# Patient Record
Sex: Male | Born: 1970 | Race: White | Hispanic: No | Marital: Single | Smoking: Never smoker
Health system: Southern US, Community
[De-identification: ages and names within clinical notes are randomized; demographics above are authoritative.]

---

## 2012-10-28 ENCOUNTER — Ambulatory Visit (HOSPITAL_BASED_OUTPATIENT_CLINIC_OR_DEPARTMENT_OTHER): Payer: BC Managed Care – PPO | Attending: Physician Assistant | Admitting: Radiology

## 2012-10-28 VITALS — Ht 68.0 in | Wt 373.0 lb

## 2012-10-28 DIAGNOSIS — G4733 Obstructive sleep apnea (adult) (pediatric): Secondary | ICD-10-CM | POA: Insufficient documentation

## 2012-10-31 DIAGNOSIS — G4733 Obstructive sleep apnea (adult) (pediatric): Secondary | ICD-10-CM

## 2012-10-31 NOTE — Procedures (Signed)
NAME:  Willie Robbins, Willie Robbins NO.:  [id number]  MEDICAL RECORD NO.:  [id number]          PATIENT TYPE:  OUT  LOCATION:  SLEEP CENTER                 FACILITY:  [facility]  PHYSICIAN:  Clinton D. Maple Hudson, MD, FCCP, FACPDATE OF BIRTH:  08-11-1969  DATE OF STUDY:  10/28/2012                           NOCTURNAL POLYSOMNOGRAM  REFERRING PHYSICIAN:  Guy Sandifer. Henderson Cloud, M.D.  INDICATION FOR STUDY:  Hypersomnia with sleep apnea.  Epworth sleepiness score 22/24, BMI 56.7, weight 373 pounds, height 68 inches, neck 17.5 inches.  MEDICATIONS:  Home medications are charted for review.  SLEEP ARCHITECTURE:  Split study protocol.  During the diagnostic phase, total sleep time 128.5 minutes with sleep efficiency 64.1%.  Stage I was 12.8%, stage II 87.2%, stage III and REM were absent.  Sleep latency 19 minutes.  Awake after sleep onset 48.5 minutes.  Arousal index of 114.4.  BEDTIME MEDICATION:  None.  RESPIRATORY DATA:  Split study protocol.  Apnea/hypopnea index (AHI) 130.3 per hour.  A total of 279 events was scored including 135 obstructive apneas, 1 central apnea, 26 mixed apneas, and 117 hypopneas. Events were not positional.  CPAP was titrated to 16 CWP, AHI 0 per hour.  He wore a medium [employer] Simplus full-face mask with heated humidifier.  OXYGEN DATA:  Loud snoring with oxygen desaturation to a nadir of 55% before CPAP.  With CPAP control, snoring was prevented, and mean oxygen saturation of 91.2% on room air.  CARDIAC DATA:  Sinus rhythm with occasional PAC and PVC.  MOVEMENT/PARASOMNIA:  No significant movement disturbance.  Bathroom x2.  IMPRESSION/RECOMMENDATION: 1. Severe obstructive sleep apnea/hypopnea syndrome, AHI 130.3 per     hour with non-positional events.  Loud snoring with oxygen     desaturation to a nadir of 55% on room air. 2. Successful CPAP titration to 16 CWP, AHI 0 per hour.  He wore a     medium [employer] Simplus full-     face  mask with heated humidifier.  Snoring was prevented and mean     oxygen saturation held 91.2% on room air with CPAP.     Clinton D. Maple Hudson, MD, [facility], FACP Diplomate, American Board of Sleep Medicine    CDY/MEDQ  D:  10/31/2012 09:25:09  T:  10/31/2012 11:27:21  Job:  [id number]

## 2013-03-12 ENCOUNTER — Ambulatory Visit (INDEPENDENT_AMBULATORY_CARE_PROVIDER_SITE_OTHER): Payer: Self-pay | Admitting: Surgery

## 2018-07-17 ENCOUNTER — Emergency Department (HOSPITAL_COMMUNITY)
Admission: EM | Admit: 2018-07-17 | Discharge: 2018-07-17 | Disposition: A | Discharge status: Home / Self Care | Payer: Self-pay | Attending: Emergency Medicine | Admitting: Emergency Medicine

## 2018-07-17 ENCOUNTER — Other Ambulatory Visit: Payer: Self-pay

## 2018-07-17 ENCOUNTER — Emergency Department (HOSPITAL_COMMUNITY): Payer: Self-pay

## 2018-07-17 ENCOUNTER — Encounter (HOSPITAL_COMMUNITY): Payer: Self-pay | Admitting: Emergency Medicine

## 2018-07-17 DIAGNOSIS — M25511 Pain in right shoulder: Secondary | ICD-10-CM | POA: Insufficient documentation

## 2018-07-17 MED ORDER — DICLOFENAC EPOLAMINE 1.3 % TD PTCH
1.0000 | MEDICATED_PATCH | Freq: Two times a day (BID) | TRANSDERMAL | Status: DC
  Administered 2018-07-17: 08:00:00 1 via TRANSDERMAL
  Filled 2018-07-17: qty 1

## 2018-07-17 MED ORDER — DICLOFENAC EPOLAMINE 1.3 % TD PTCH: 1.0000 | MEDICATED_PATCH | Freq: Two times a day (BID) | TRANSDERMAL | 1 refills | Status: AC

## 2018-07-17 MED ORDER — PREDNISONE 20 MG PO TABS: 40.0000 mg | ORAL_TABLET | Freq: Every day | ORAL | 0 refills | Status: AC

## 2018-07-17 NOTE — ED Provider Notes (Signed)
[facility]   CSN: [id number] Arrival date & time: 07/17/18  [id number]    History   Chief Complaint Chief Complaint  Patient presents with  . Shoulder Pain    HPI Willie JunesJames R Spina Jr. is a 48 y.o. male.     HPI Patient presents concern of shoulder pain. Patient has had issues with his shoulder for at least 6 months after an initial fall that occurred, with subsequent minor trauma as well. He notes that since the initial event he has had soreness, pain with range of motion of the shoulder, which has been chronic, without clear attempts at relief. 1 week ago the patient saw a chiropractor for the first time. He notes that after manipulation of the arm, neck, he had transient improvement. He has not yet seen the chiropractor again. Overnight the patient was restless, had unrelenting pain in the shoulder, worse with motion, preventing him from sleeping. No pain in the elbow, wrist, no numbness or tingling distally, no additional medication take this morning for relief. History reviewed. No pertinent past medical history.  There are no active problems to display for this patient.   History reviewed. No pertinent surgical history.      Home Medications    Prior to Admission medications   Medication Sig Start Date End Date Taking? Authorizing Provider  diclofenac (FLECTOR) 1.3 % PT[facility] Place 1 patch onto the skin 2 (two) times daily. 07/17/18   Gerhard MunchLockwood, Roger Kettles, MD  predniSONE (DELTASONE) 20 MG tablet Take 2 tablets (40 mg total) by mouth daily with breakfast. For the next four days 07/17/18   Gerhard MunchLockwood, Severn Goddard, MD    Family History History reviewed. No pertinent family history.  Social History Social History   Tobacco Use  . Smoking status: Never Smoker  . Smokeless tobacco: Never Used  Substance Use Topics  . Alcohol use: Yes  . Drug use: Never     Allergies   Patient has no allergy information on record.   Review  of Systems Review of Systems  Constitutional:       Per HPI, otherwise negative  HENT:       Per HPI, otherwise negative  Respiratory:       Per HPI, otherwise negative  Cardiovascular:       Per HPI, otherwise negative  Gastrointestinal: Negative for vomiting.  Endocrine:       Negative aside from HPI  Genitourinary:       Neg aside from HPI   Musculoskeletal:       Per HPI, otherwise negative  Skin: Negative.   Neurological: Negative for syncope, weakness and numbness.     Physical Exam Updated Vital Signs BP (!) 146/81 (BP Location: Left Arm)   Pulse 71   Temp 97.7 F (36.5 C)   Resp 16   SpO2 97%   Physical Exam Vitals signs and nursing note reviewed.  Constitutional:      General: He is not in acute distress.    Appearance: He is well-developed.  HENT:     Head: Normocephalic and atraumatic.  Eyes:     Conjunctiva/sclera: Conjunctivae normal.  Cardiovascular:     Rate and Rhythm: Normal rate and regular rhythm.  Pulmonary:     Effort: Pulmonary effort is normal. No respiratory distress.     Breath sounds: No stridor.  Abdominal:     General: There is no distension.  Musculoskeletal:     Right elbow: Normal.    Right  wrist: Normal.       Arms:  Skin:    General: Skin is warm and dry.  Neurological:     Mental Status: He is alert and oriented to person, place, and time.      ED Treatments / Results  Labs (all labs ordered are listed, but only abnormal results are displayed) Labs Reviewed - No data to display  EKG None  Radiology Dg Shoulder Right  Result Date: 07/17/2018 CLINICAL DATA:  Acute right shoulder pain after lifting injury. EXAM: RIGHT SHOULDER - 2+ VIEW COMPARISON:  None. FINDINGS: There is no evidence of fracture or dislocation. There is no evidence of arthropathy or other focal bone abnormality. Soft tissues are unremarkable. IMPRESSION: Negative. Electronically Signed   By: Lupita Raider M.D.   On: 07/17/2018 08:25     Procedures Procedures (including critical care time)  Medications Ordered in ED Medications  diclofenac (FLECTOR) 1.3 % 1 patch (1 patch Transdermal Patch Applied 07/17/18 0810)     Initial Impression / Assessment and Plan / ED Course  I have reviewed the triage vital signs and the nursing notes.  Pertinent labs & imaging results that were available during my care of the patient were reviewed by me and considered in my medical decision making (see chart for details).        8:44 AM On repeat exam the patient is awake, alert, sitting upright He has received diclofenac patch per We reviewed the x-ray together, and I demonstrated absence of fracture, suspicion for some soft tissue injury discussed at length. With no distal neurovascular compromise, no other focal complaints, there is suspicion for acute exacerbation of shoulder pain Patient will follow-up with chiropractic, orthopedists as well. For relief patient will have sling provided, will start a short course of medication.  Final Clinical Impressions(s) / ED Diagnoses   Final diagnoses:  Acute pain of right shoulder    ED Discharge Orders         Ordered    diclofenac (FLECTOR) 1.3 % PT[facility]  2 times daily     07/17/18 0844    predniSONE (DELTASONE) 20 MG tablet  Daily with breakfast     07/17/18 0844           Gerhard Munch, MD 07/17/18 [id number]

## 2018-07-17 NOTE — ED Notes (Signed)
Patient transported to X-ray 

## 2018-07-17 NOTE — Discharge Instructions (Signed)
As discussed, today's evaluation has been generally reassuring. There is suspicion for soft tissue injury to your shoulder. Please continue to work with your chiropractor, and if needed, follow-up with our orthopedic surgery colleagues as well. If the patch prescription is prohibitive, please use cream alternative, which will provide similar results.  Return here for concerning changes in your condition.

## 2018-07-17 NOTE — ED Triage Notes (Signed)
Pt arrives from home. Pt complaining of right shoulder pain x2 weeks. Pt reports the pain gradually getting worse over that time. Pt has history of injury to that shoulder in the past.

## 2018-07-17 NOTE — ED Triage Notes (Signed)
Pt in with R shoulder pain, worsened x past few days. States it has been injured before, feels he has injured it again. Full ROM present, pain radiates into R chest

## 2020-10-04 IMAGING — CR RIGHT SHOULDER - 2+ VIEW
3 series · 3 of 3 positions shown · non-contrast
Comparison: None.

CLINICAL DATA: Acute right shoulder pain after lifting injury.

EXAM:
RIGHT SHOULDER - 2+ VIEW

[shoulder grashey]
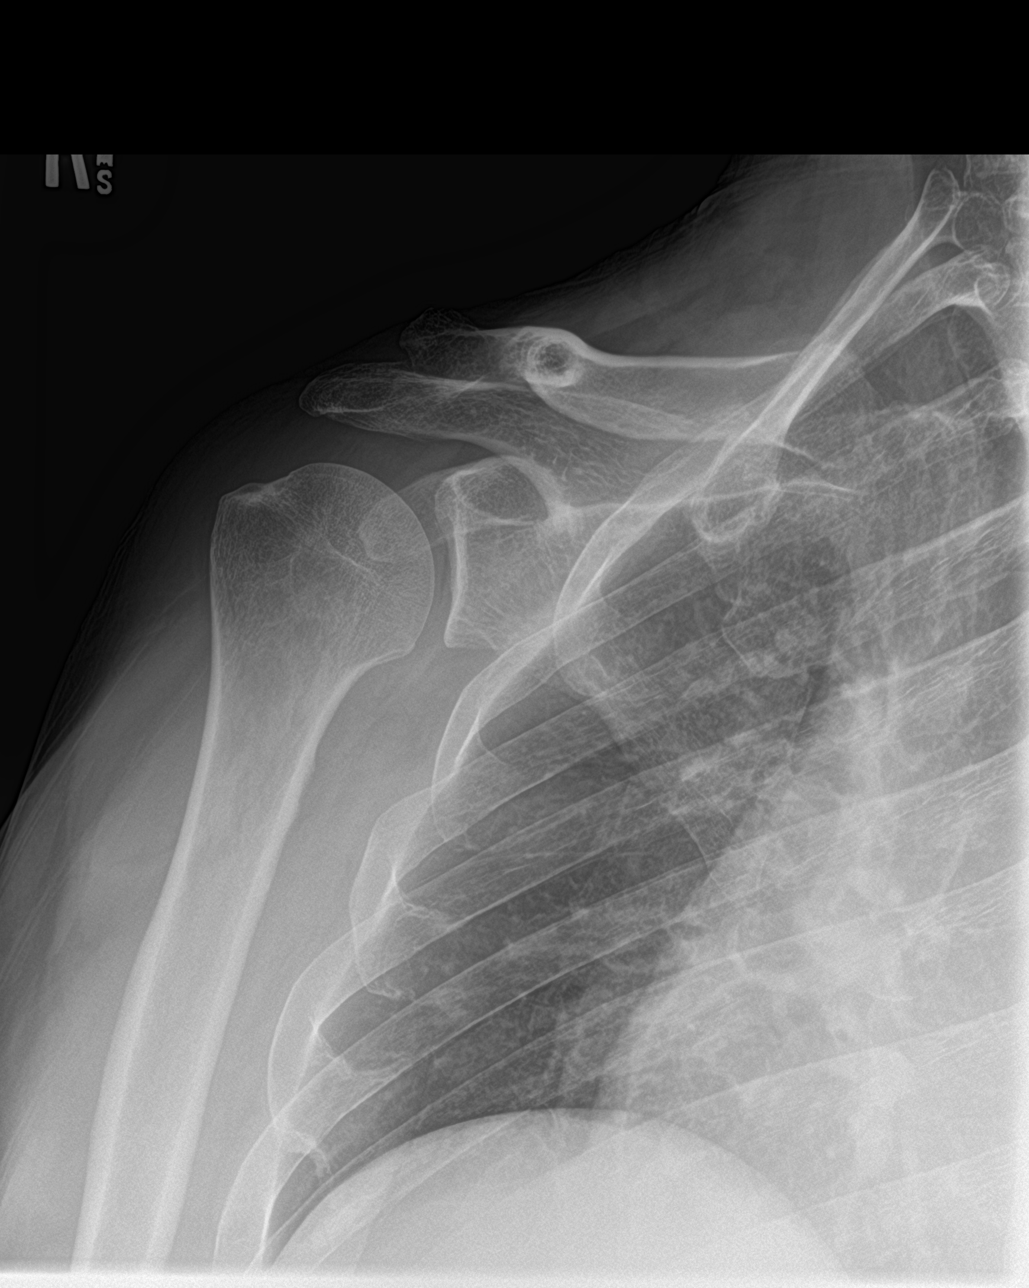

[shoulder y view]
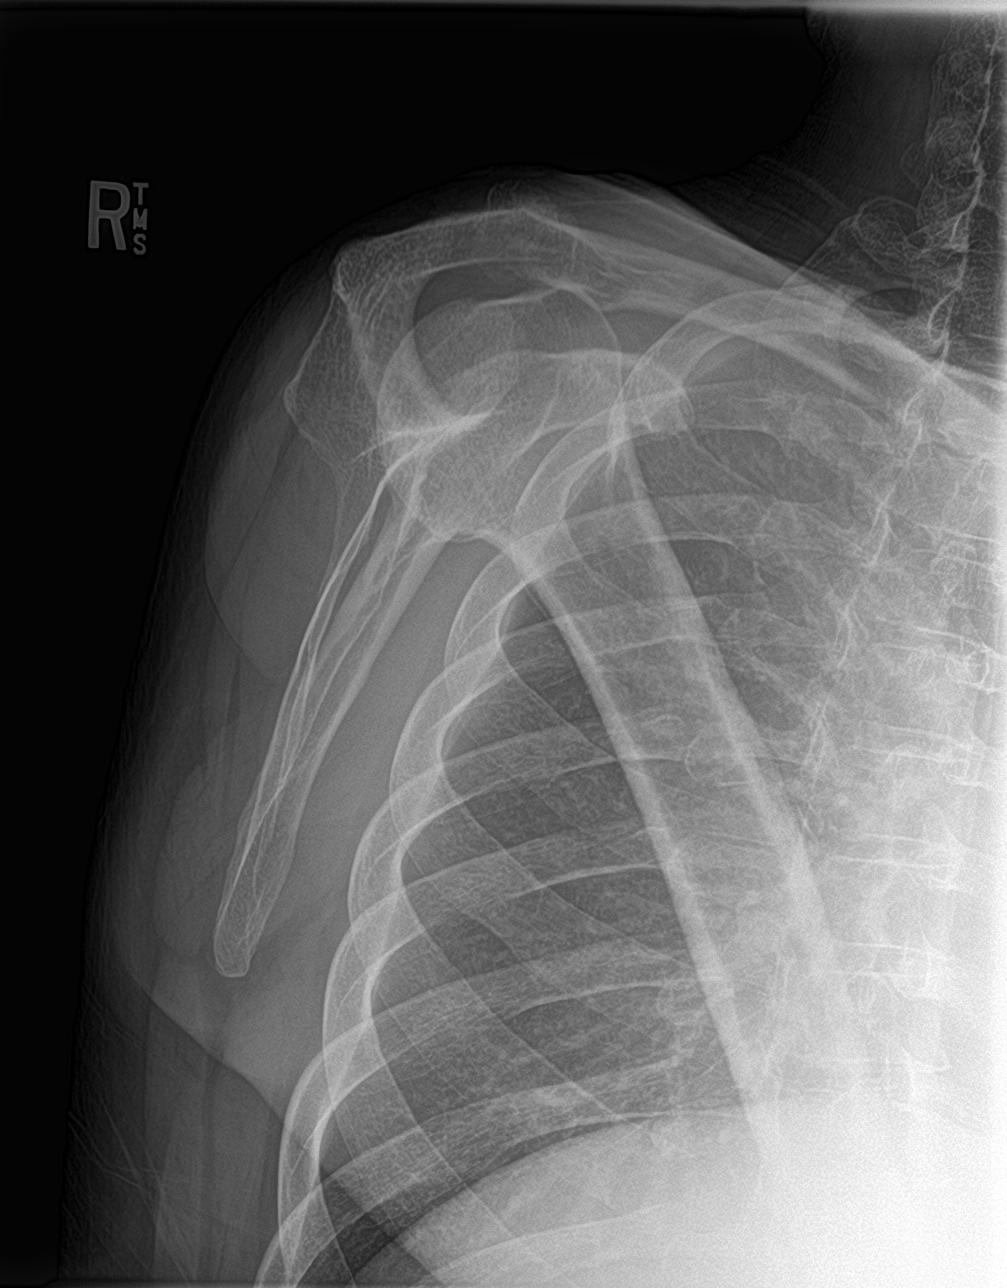

[shoulder axillary]
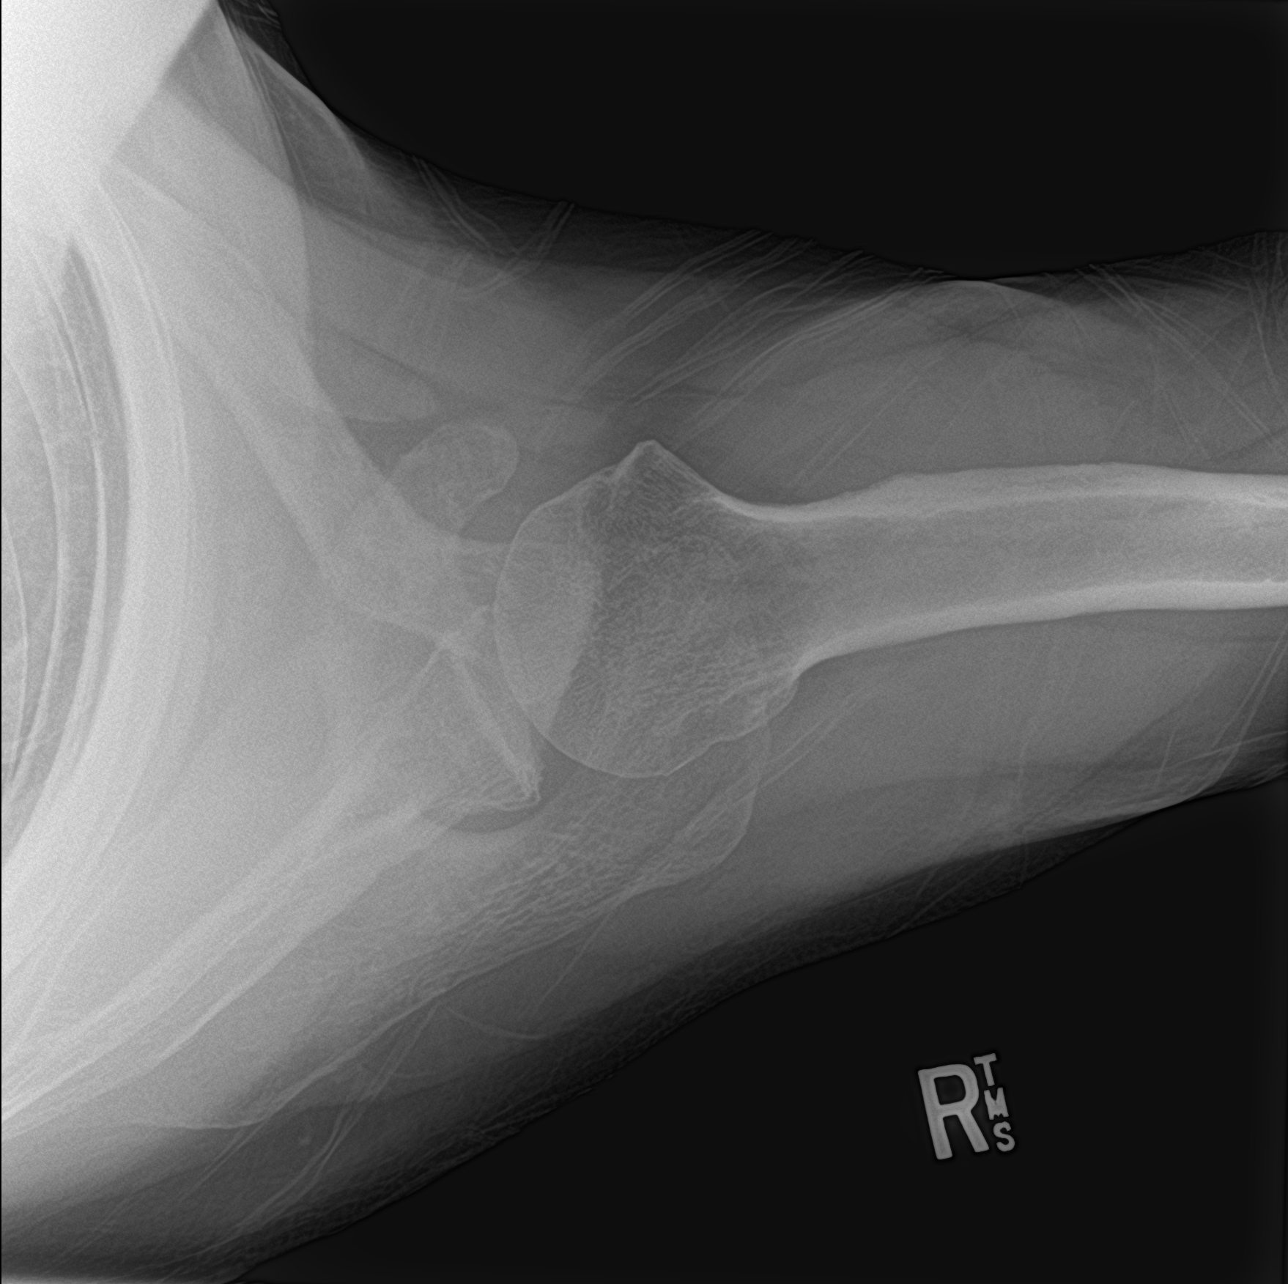

[3 of 3 positions shown; findings below may reference images not displayed]

FINDINGS: There is no evidence of fracture or dislocation. There is no
evidence of arthropathy or other focal bone abnormality. Soft
tissues are unremarkable.
IMPRESSION: Negative.
